# Patient Record
Sex: Male | Born: 1990 | Race: White | Hispanic: No | Marital: Single | Smoking: Former smoker
Health system: Southern US, Community
[De-identification: ages and names within clinical notes are randomized; demographics above are authoritative.]

---
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
## 2012-10-02 ENCOUNTER — Other Ambulatory Visit: Payer: Self-pay | Admitting: Gastroenterology

## 2012-10-02 DIAGNOSIS — R109 Unspecified abdominal pain: Secondary | ICD-10-CM

## 2012-10-09 ENCOUNTER — Ambulatory Visit
Admission: RE | Admit: 2012-10-09 | Discharge: 2012-10-09 | Disposition: A | Discharge status: Home / Self Care | Payer: BC Managed Care – PPO | Source: Ambulatory Visit | Attending: Gastroenterology | Admitting: Gastroenterology

## 2012-10-09 DIAGNOSIS — R109 Unspecified abdominal pain: Secondary | ICD-10-CM

## 2014-12-20 IMAGING — US US ABDOMEN COMPLETE
1 series · 14 of 25 positions shown · non-contrast
Comparison: None.

CLINICAL DATA: Abdominal pain

COMPLETE ABDOMINAL ULTRASOUND

[Series 1: us abdomen complete · 0.18mm/px · 14 of 86 slices shown]
[im 1/86]
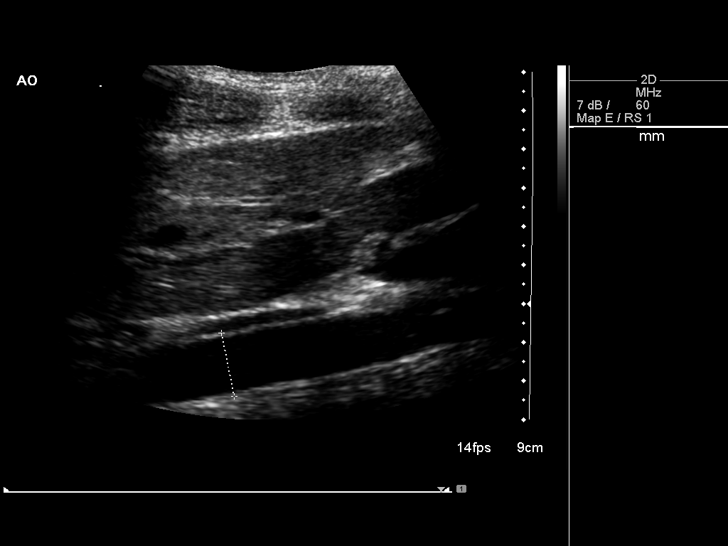
[im 8/86]
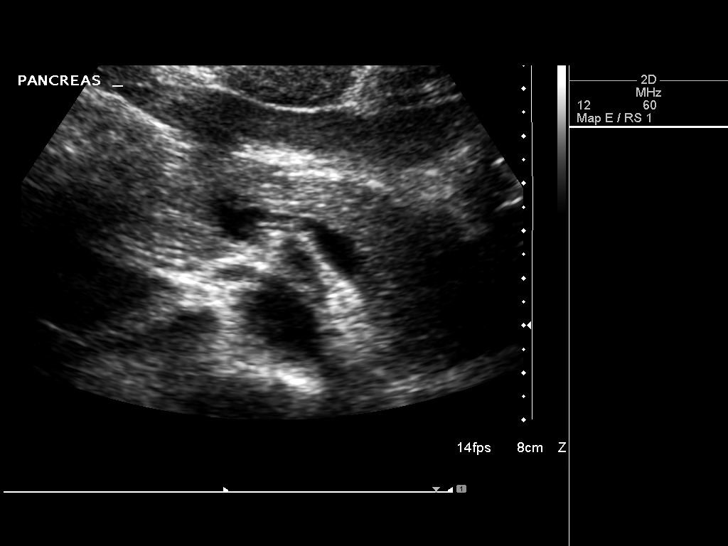
[im 15/86]
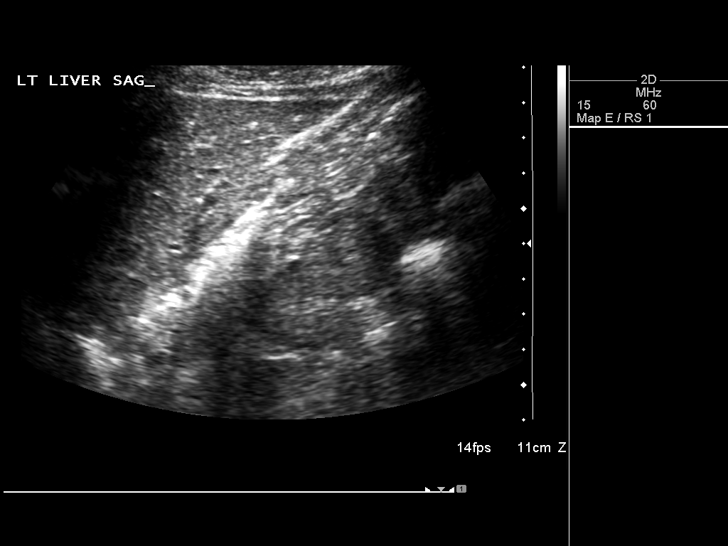
[im 22/86]
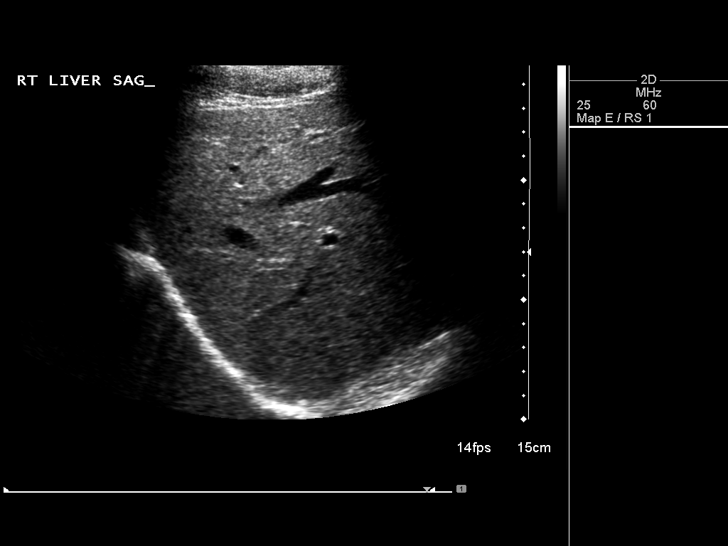
[im 29/86]
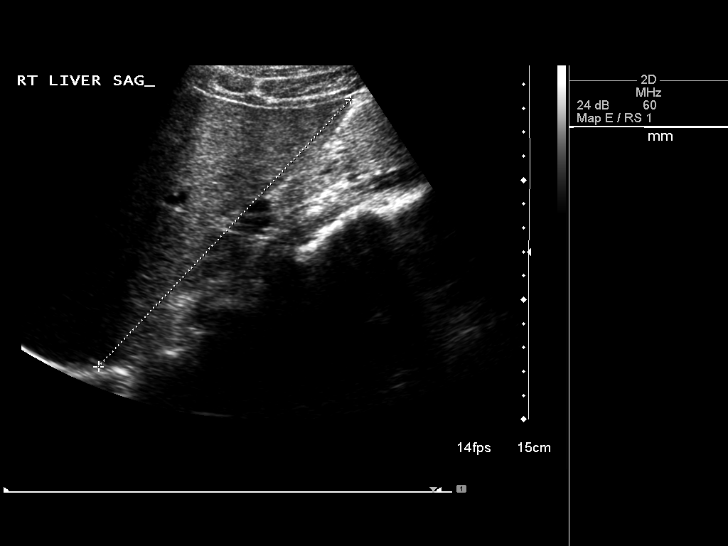
[im 32/86]
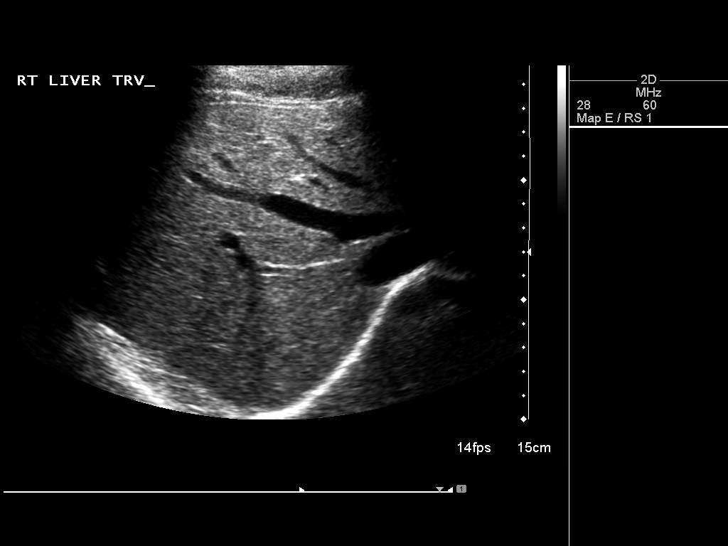
[im 39/86]
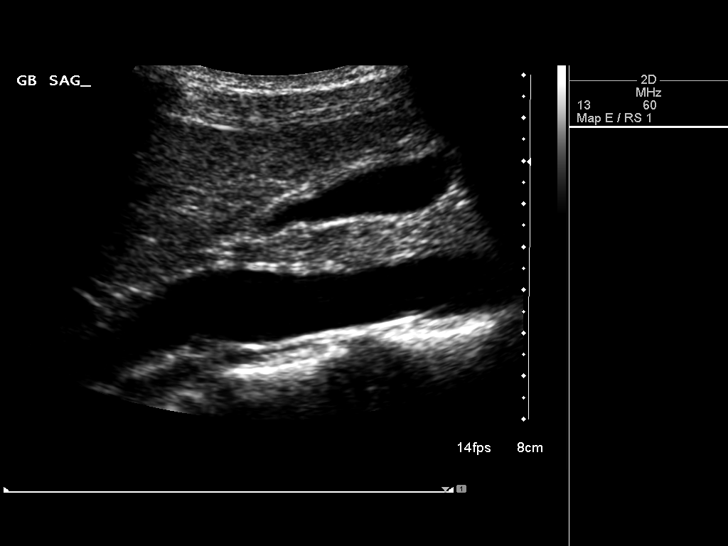
[im 47/86]
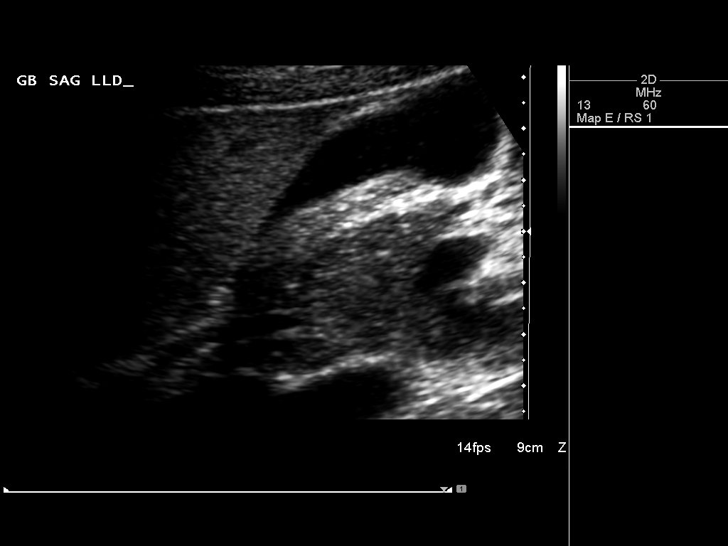
[im 54/86]
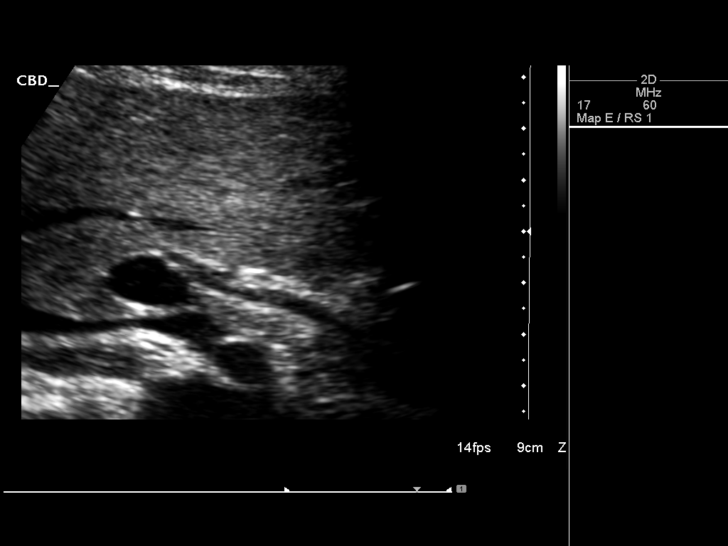
[im 57/86]
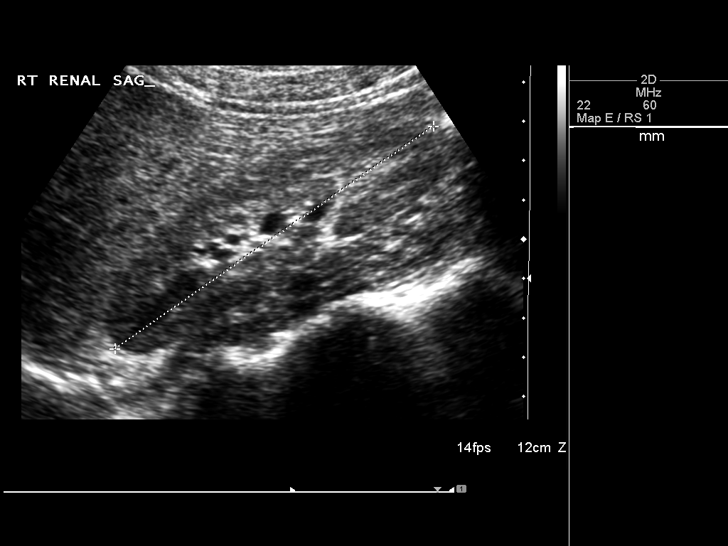
[im 64/86]
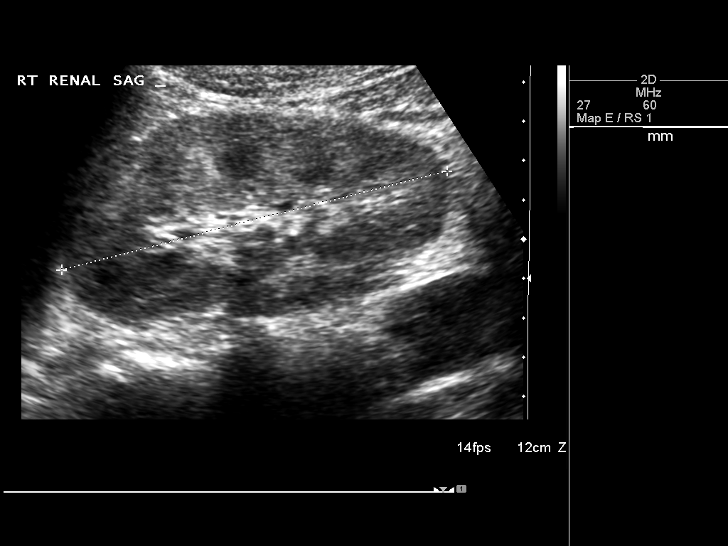
[im 71/86]
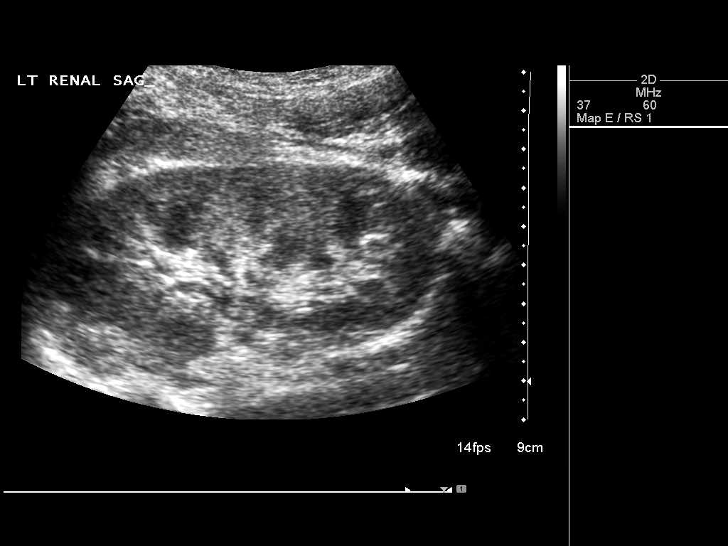
[im 78/86]
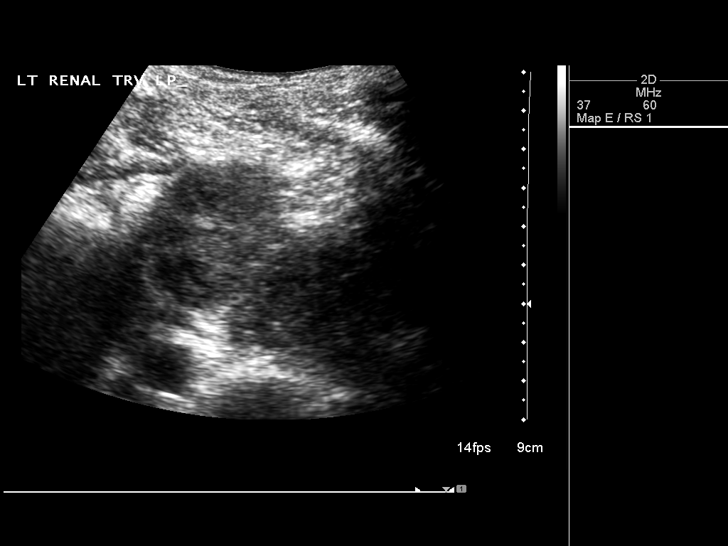
[im 86/86]
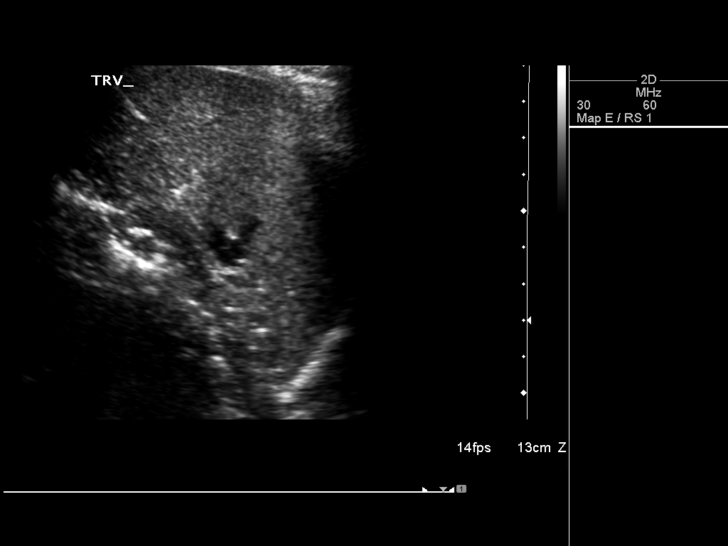

[14 of 25 positions shown; findings below may reference images not displayed]

FINDINGS: Gallbladder:  The gallbladder is visualized and no gallstones are
noted.  There is no pain over the gallbladder with compression.

Common bile duct:  The common bile duct is normal measuring 3.3 mm
in diameter.

Liver:  The liver has a normal echogenic pattern.  No focal
abnormality is seen.

IVC:  Appears normal.

Pancreas:  No focal abnormality seen.

Spleen:  The spleen is normal measuring 6.2 cm sagittally.

Right Kidney:  No hydronephrosis is seen.  The right kidney
measures 10.1 cm sagittally.

Left Kidney:  No hydronephrosis is noted.  The left kidney measures
10.5 cm.

Abdominal aorta:  The abdominal aorta is normal in caliber.
IMPRESSION: Negative abdominal ultrasound.  No gallstones.  No ductal
dilatation

## 2016-05-09 ENCOUNTER — Emergency Department (HOSPITAL_COMMUNITY): Payer: 59

## 2016-05-09 ENCOUNTER — Encounter (HOSPITAL_COMMUNITY): Payer: Self-pay | Admitting: Emergency Medicine

## 2016-05-09 ENCOUNTER — Emergency Department (HOSPITAL_COMMUNITY)
Admission: EM | Admit: 2016-05-09 | Discharge: 2016-05-09 | Disposition: A | Discharge status: Home / Self Care | Payer: 59 | Attending: Emergency Medicine | Admitting: Emergency Medicine

## 2016-05-09 DIAGNOSIS — F17210 Nicotine dependence, cigarettes, uncomplicated: Secondary | ICD-10-CM | POA: Insufficient documentation

## 2016-05-09 DIAGNOSIS — R1013 Epigastric pain: Secondary | ICD-10-CM | POA: Insufficient documentation

## 2016-05-09 DIAGNOSIS — R197 Diarrhea, unspecified: Secondary | ICD-10-CM | POA: Insufficient documentation

## 2016-05-09 DIAGNOSIS — R112 Nausea with vomiting, unspecified: Secondary | ICD-10-CM | POA: Insufficient documentation

## 2016-05-09 LAB — COMPREHENSIVE METABOLIC PANEL
ALK PHOS: 53 U/L (ref 38–126)
ALT: 22 U/L (ref 17–63)
ANION GAP: 16 — AB (ref 5–15)
AST: 32 U/L (ref 15–41)
Albumin: 5 g/dL (ref 3.5–5.0)
BILIRUBIN TOTAL: 0.6 mg/dL (ref 0.3–1.2)
BUN: 13 mg/dL (ref 6–20)
CALCIUM: 10.1 mg/dL (ref 8.9–10.3)
CO2: 21 mmol/L — ABNORMAL LOW (ref 22–32)
CREATININE: 1.19 mg/dL (ref 0.61–1.24)
Chloride: 102 mmol/L (ref 101–111)
GFR calc non Af Amer: >60 mL/min (ref >60)
Glucose, Bld: 138 mg/dL — ABNORMAL HIGH (ref 65–99)
Potassium: 3.4 mmol/L — ABNORMAL LOW (ref 3.5–5.1)
Sodium: 139 mmol/L (ref 135–145)
TOTAL PROTEIN: 7.7 g/dL (ref 6.5–8.1)

## 2016-05-09 LAB — CBC
HEMATOCRIT: 48.7 % (ref 39.0–52.0)
HEMOGLOBIN: 17.4 g/dL — AB (ref 13.0–17.0)
MCH: 30.6 pg (ref 26.0–34.0)
MCHC: 35.7 g/dL (ref 30.0–36.0)
MCV: 85.7 fL (ref 78.0–100.0)
Platelets: 233 10*3/uL (ref 150–400)
RBC: 5.68 MIL/uL (ref 4.22–5.81)
RDW: 12.4 % (ref 11.5–15.5)
WBC: 13.1 10*3/uL — ABNORMAL HIGH (ref 4.0–10.5)

## 2016-05-09 MED ORDER — ONDANSETRON HCL 4 MG PO TABS: 4.0000 mg | ORAL_TABLET | Freq: Three times a day (TID) | ORAL | 0 refills | Status: DC | PRN

## 2016-05-09 MED ORDER — MORPHINE SULFATE (PF) 4 MG/ML IV SOLN
4.0000 mg | Freq: Once | INTRAVENOUS | Status: AC
  Administered 2016-05-09: 4 mg via INTRAVENOUS
  Filled 2016-05-09: qty 1

## 2016-05-09 MED ORDER — ONDANSETRON HCL 4 MG/2ML IJ SOLN
INTRAMUSCULAR | Status: AC
  Filled 2016-05-09: qty 2

## 2016-05-09 MED ORDER — ONDANSETRON HCL 4 MG/2ML IJ SOLN
4.0000 mg | Freq: Once | INTRAMUSCULAR | Status: AC
  Administered 2016-05-09: 4 mg via INTRAVENOUS
  Filled 2016-05-09: qty 2

## 2016-05-09 MED ORDER — PROMETHAZINE HCL 25 MG/ML IJ SOLN
25.0000 mg | Freq: Once | INTRAMUSCULAR | Status: AC
  Administered 2016-05-09: 25 mg via INTRAVENOUS
  Filled 2016-05-09: qty 1

## 2016-05-09 MED ORDER — IOPAMIDOL (ISOVUE-300) INJECTION 61%
INTRAVENOUS | Status: AC
  Administered 2016-05-09: 100 mL
  Filled 2016-05-09: qty 100

## 2016-05-09 MED ORDER — MORPHINE SULFATE (PF) 4 MG/ML IV SOLN: 4.0000 mg | Freq: Once | INTRAVENOUS | Status: DC

## 2016-05-09 MED ORDER — ONDANSETRON HCL 4 MG/2ML IJ SOLN
4.0000 mg | Freq: Once | INTRAMUSCULAR | Status: AC | PRN
  Administered 2016-05-09: 4 mg via INTRAVENOUS

## 2016-05-09 MED ORDER — FENTANYL CITRATE (PF) 100 MCG/2ML IJ SOLN
50.0000 ug | Freq: Once | INTRAMUSCULAR | Status: AC
  Administered 2016-05-09: 50 ug via INTRAVENOUS
  Filled 2016-05-09: qty 2

## 2016-05-09 MED ORDER — MORPHINE SULFATE (PF) 4 MG/ML IV SOLN
2.0000 mg | Freq: Once | INTRAVENOUS | Status: AC
  Administered 2016-05-09: 2 mg via INTRAVENOUS
  Filled 2016-05-09: qty 1

## 2016-05-09 MED ORDER — HYDROCODONE-ACETAMINOPHEN 5-325 MG PO TABS: ORAL_TABLET | ORAL | 0 refills | Status: AC

## 2016-05-09 MED ORDER — ONDANSETRON HCL 4 MG/2ML IJ SOLN: 4.0000 mg | Freq: Once | INTRAMUSCULAR | Status: DC

## 2016-05-09 MED ORDER — SODIUM CHLORIDE 0.9 % IV BOLUS (SEPSIS)
2000.0000 mL | Freq: Once | INTRAVENOUS | Status: AC
  Administered 2016-05-09: 2000 mL via INTRAVENOUS

## 2016-05-09 MED ORDER — ONDANSETRON HCL 4 MG/2ML IJ SOLN
4.0000 mg | Freq: Once | INTRAMUSCULAR | Status: AC
  Administered 2016-05-09: 4 mg via INTRAVENOUS

## 2016-05-09 NOTE — ED Notes (Signed)
Dr. Blinda LeatherwoodPollina updated on pt.'s persistent emesis . 2nd Zofran ordered.

## 2016-05-09 NOTE — ED Notes (Signed)
Declined W/C at D/C and was escorted to lobby by RN. 

## 2016-05-09 NOTE — ED Triage Notes (Signed)
Pt. reports generalized abdominal pain with emesis and diarrhea onset this evening , denies fever or chills.  

## 2016-05-09 NOTE — ED Provider Notes (Signed)
[facility]-EMERGENCY DEPT Provider Note   CSN: [id number] Arrival date & time: 05/09/16  0300     History   Chief Complaint Chief Complaint  Patient presents with  . Abdominal Pain  . Emesis  . Diarrhea    HPI   Blood pressure 105/55, pulse 68, temperature 97.8 F (36.6 C), temperature source Oral, resp. rate 24, height 5\' 7"  (1.702 m), weight 54.4 kg, SpO2 96 %.  Leroy Lowery is a 26 y.o. male who is otherwise healthy complaining of acute onset of epigastric pain at approximately midnight last night, the pain woke him from sleep and it was followed by nonbloody, nonbilious, no coffee-ground emesis with diarrhea that is looser than normal but no hematochezia or melena. He denies fevers, chills, sick contacts. He states the pain is severe, 10 out of 10 with no exacerbating or alleviating factors identified.  History reviewed. No pertinent past medical history.  There are no active problems to display for this patient.   History reviewed. No pertinent surgical history.     Home Medications    Prior to Admission medications   Medication Sig Start Date End Date Taking? Authorizing Provider  HYDROcodone-acetaminophen (NORCO/VICODIN) 5-325 MG tablet Take 1-2 tablets by mouth every 6 hours as needed for pain and/or cough. 05/09/16   Alegria Dominique, PA-C  ondansetron (ZOFRAN) 4 MG tablet Take 1 tablet (4 mg total) by mouth every 8 (eight) hours as needed for nausea or vomiting. 05/09/16   Joni ReiningNicole Daylyn Christine, PA-C    Family History No family history on file.  Social History Social History  Substance Use Topics  . Smoking status: Current Every Day Smoker    Types: Cigarettes  . Smokeless tobacco: Never Used  . Alcohol use No     Allergies   Patient has no known allergies.   Review of Systems Review of Systems  10 systems reviewed and found to be negative, except as noted in the HPI.  Physical Exam Updated Vital Signs BP 105/56   Pulse 90   Temp 97.8 F (36.6 C) (Oral)    Resp 24   Ht 5\' 7"  (1.702 m)   Wt 54.4 kg   SpO2 98%   BMI 18.79 kg/m   Physical Exam  Constitutional: He is oriented to person, place, and time. He appears well-developed and well-nourished. No distress.  HENT:  Head: Normocephalic and atraumatic.  Mouth/Throat: Oropharynx is clear and moist.  Eyes: Conjunctivae and EOM are normal. Pupils are equal, round, and reactive to light.  Neck: Normal range of motion.  Cardiovascular: Normal rate, regular rhythm and intact distal pulses.   Pulmonary/Chest: Effort normal and breath sounds normal. No respiratory distress. He has no wheezes. He has no rales.  Abdominal: Soft. There is tenderness.  Hyperactive bowel signs, diffusely is only tender to light palpation, worse in the epigastrium. No guarding or rebound.  Musculoskeletal: Normal range of motion.  Neurological: He is alert and oriented to person, place, and time.  Skin: He is not diaphoretic. Erythema: .npmdm.  Psychiatric: He has a normal mood and affect.  Nursing note and vitals reviewed.    ED Treatments / Results  Labs (all labs ordered are listed, but only abnormal results are displayed) Labs Reviewed  COMPREHENSIVE METABOLIC PANEL - Abnormal; Notable for the following:       Result Value   Potassium 3.4 (*)    CO2 21 (*)    Glucose, Bld 138 (*)    Anion gap 16 (*)  All other components within normal limits  CBC - Abnormal; Notable for the following:    WBC 13.1 (*)    Hemoglobin 17.4 (*)    All other components within normal limits  URINALYSIS, ROUTINE W REFLEX MICROSCOPIC    EKG  EKG Interpretation None       Radiology Ct Abdomen Pelvis W Contrast  Result Date: 05/09/2016 CLINICAL DATA:  Epigastric pain, nausea, vomiting since midnight EXAM: CT ABDOMEN AND PELVIS WITH CONTRAST TECHNIQUE: Multidetector CT imaging of the abdomen and pelvis was performed using the standard protocol following bolus administration of intravenous contrast. CONTRAST:   ISOVUE-300 IOPAMIDOL (ISOVUE-300) INJECTION 61% COMPARISON:  None. FINDINGS: Lower chest: No acute abnormality. Hepatobiliary: No focal liver abnormality is seen. No gallstones, gallbladder wall thickening, or biliary dilatation. Pancreas: Unremarkable. No pancreatic ductal dilatation or surrounding inflammatory changes. Spleen: Normal in size without focal abnormality. Adrenals/Urinary Tract: Adrenal glands are unremarkable. Kidneys are normal, without renal calculi, focal lesion, or hydronephrosis. Bladder is unremarkable. Stomach/Bowel: Stomach is within normal limits. No normal nor abnormal appendix is identified. No pneumoperitoneum, pneumatosis or portal venous gas. Bowel wall thickening involving the small bowel and colon as can be seen with enterocolitis secondary to an infectious or inflammatory etiology. Vascular/Lymphatic: No significant vascular findings are present. No enlarged abdominal or pelvic lymph nodes. Reproductive: Prostate is unremarkable. Other: No abdominal wall hernia or abnormality. No abdominopelvic ascites. Musculoskeletal: No acute or significant osseous findings. IMPRESSION: 1. Bowel wall thickening involving the small bowel and colon as can be seen with enterocolitis secondary to an infectious or inflammatory etiology. Electronically Signed   By: Elige Ko   On: 05/09/2016 08:26    Procedures Procedures (including critical care time)  Medications Ordered in ED Medications  ondansetron (ZOFRAN) injection 4 mg (4 mg Intravenous Given 05/09/16 0344)  fentaNYL (SUBLIMAZE) injection 50 mcg (50 mcg Intravenous Given 05/09/16 0344)  ondansetron (ZOFRAN) injection 4 mg (4 mg Intravenous Given 05/09/16 0541)  sodium chloride 0.9 % bolus 2,000 mL (0 mLs Intravenous Stopped 05/09/16 1341)  morphine 4 MG/ML injection 4 mg (4 mg Intravenous Given 05/09/16 0711)  ondansetron (ZOFRAN) injection 4 mg (4 mg Intravenous Given 05/09/16 0711)  iopamidol (ISOVUE-300) 61 % injection (100 mLs  Contrast  Given 05/09/16 0754)  promethazine (PHENERGAN) injection 25 mg (25 mg Intravenous Given 05/09/16 1040)  morphine 4 MG/ML injection 2 mg (2 mg Intravenous Given 05/09/16 1038)     Initial Impression / Assessment and Plan / ED Course  I have reviewed the triage vital signs and the nursing notes.  Pertinent labs & imaging results that were available during my care of the patient were reviewed by me and considered in my medical decision making (see chart for details).  Clinical Course     Vitals:   05/09/16 1000 05/09/16 1015 05/09/16 1045 05/09/16 1131  BP: 116/64 114/63 109/61 105/56  Pulse: 71 77 71 90  Resp:      Temp:      TempSrc:      SpO2: 92% 100% 100% 98%  Weight:      Height:        Medications  ondansetron (ZOFRAN) injection 4 mg (4 mg Intravenous Given 05/09/16 0344)  fentaNYL (SUBLIMAZE) injection 50 mcg (50 mcg Intravenous Given 05/09/16 0344)  ondansetron (ZOFRAN) injection 4 mg (4 mg Intravenous Given 05/09/16 0541)  sodium chloride 0.9 % bolus 2,000 mL (0 mLs Intravenous Stopped 05/09/16 1341)  morphine 4 MG/ML injection 4 mg (4 mg Intravenous Given  05/09/16 0711)  ondansetron (ZOFRAN) injection 4 mg (4 mg Intravenous Given 05/09/16 0711)  iopamidol (ISOVUE-300) 61 % injection (100 mLs  Contrast Given 05/09/16 0754)  promethazine (PHENERGAN) injection 25 mg (25 mg Intravenous Given 05/09/16 1040)  morphine 4 MG/ML injection 2 mg (2 mg Intravenous Given 05/09/16 1038)    Leroy Lowery is 26 y.o. male presenting with Severe epigastric pain, woke him from sleep with associated nausea vomiting and diarrhea. Patient afebrile, looks severely uncomfortable, he has a elevated white count, his first blood pressure was soft with systolic under 90. Bicarbonate elevated and anion gap elevated. Although I think is likely a viral gastroenteritis given the severity of his pain and the abnormalities in his blood work I think we need to further investigate with imaging, CT abdomen pelvis is ordered,  advised him to remain nothing by mouth. Will aggressively hydrate andReassess.  CT abdomen pelvis with no acute abnormality thickening of the colon consistent with the enteritis, likely viral given the acute onset and vomiting. Patient is tolerating by mouth's and repeat abdominal exam unchanged.  Evaluation does not show pathology that would require ongoing emergent intervention or inpatient treatment. Pt is hemodynamically stable and mentating appropriately. Discussed findings and plan with patient/guardian, who agrees with care plan. All questions answered. Return precautions discussed and outpatient follow up given.      Final Clinical Impressions(s) / ED Diagnoses   Final diagnoses:  Nausea vomiting and diarrhea  Epigastric pain    New Prescriptions Discharge Medication List as of 05/09/2016 11:43 AM    START taking these medications   Details  HYDROcodone-acetaminophen (NORCO/VICODIN) 5-325 MG tablet Take 1-2 tablets by mouth every 6 hours as needed for pain and/or cough., Print    ondansetron (ZOFRAN) 4 MG tablet Take 1 tablet (4 mg total) by mouth every 8 (eight) hours as needed for nausea or vomiting., Starting Tue 05/09/2016, Print         [place], PA-C 05/09/16 1556    Lavera Guise, MD 05/09/16 1715

## 2016-05-09 NOTE — ED Notes (Signed)
PT GIVEN WATER PER FLUID CHALLENGE

## 2016-05-09 NOTE — Discharge Instructions (Signed)

## 2016-05-09 NOTE — ED Notes (Signed)
Pt back from CT

## 2023-01-31 ENCOUNTER — Other Ambulatory Visit: Payer: Self-pay

## 2023-01-31 ENCOUNTER — Other Ambulatory Visit (HOSPITAL_BASED_OUTPATIENT_CLINIC_OR_DEPARTMENT_OTHER): Payer: Self-pay

## 2023-01-31 ENCOUNTER — Encounter (HOSPITAL_BASED_OUTPATIENT_CLINIC_OR_DEPARTMENT_OTHER): Payer: Self-pay | Admitting: Emergency Medicine

## 2023-01-31 ENCOUNTER — Emergency Department (HOSPITAL_BASED_OUTPATIENT_CLINIC_OR_DEPARTMENT_OTHER)
Admission: EM | Admit: 2023-01-31 | Discharge: 2023-01-31 | Disposition: A | Discharge status: Home / Self Care | Payer: 59 | Attending: Emergency Medicine | Admitting: Emergency Medicine

## 2023-01-31 ENCOUNTER — Emergency Department (HOSPITAL_BASED_OUTPATIENT_CLINIC_OR_DEPARTMENT_OTHER): Payer: 59

## 2023-01-31 DIAGNOSIS — R1031 Right lower quadrant pain: Secondary | ICD-10-CM | POA: Insufficient documentation

## 2023-01-31 LAB — CBC WITH DIFFERENTIAL/PLATELET
Abs Immature Granulocytes: 0.05 10*3/uL (ref 0.00–0.07)
Basophils Absolute: 0.1 10*3/uL (ref 0.0–0.1)
Basophils Relative: 1 %
Eosinophils Absolute: 0.1 10*3/uL (ref 0.0–0.5)
Eosinophils Relative: 1 %
HCT: 46.2 % (ref 39.0–52.0)
Hemoglobin: 16 g/dL (ref 13.0–17.0)
Immature Granulocytes: 0 %
Lymphocytes Relative: 15 %
Lymphs Abs: 1.9 10*3/uL (ref 0.7–4.0)
MCH: 29.9 pg (ref 26.0–34.0)
MCHC: 34.6 g/dL (ref 30.0–36.0)
MCV: 86.2 fL (ref 80.0–100.0)
Monocytes Absolute: 0.9 10*3/uL (ref 0.1–1.0)
Monocytes Relative: 7 %
Neutro Abs: 9.7 10*3/uL — ABNORMAL HIGH (ref 1.7–7.7)
Neutrophils Relative %: 76 %
Platelets: 262 10*3/uL (ref 150–400)
RBC: 5.36 MIL/uL (ref 4.22–5.81)
RDW: 12.8 % (ref 11.5–15.5)
WBC: 12.8 10*3/uL — ABNORMAL HIGH (ref 4.0–10.5)
nRBC: 0 % (ref 0.0–0.2)

## 2023-01-31 LAB — URINALYSIS, ROUTINE W REFLEX MICROSCOPIC
Bilirubin Urine: NEGATIVE
Glucose, UA: NEGATIVE mg/dL
Hgb urine dipstick: NEGATIVE
Ketones, ur: 15 mg/dL — AB
Leukocytes,Ua: NEGATIVE
Nitrite: NEGATIVE
Protein, ur: NEGATIVE mg/dL
Specific Gravity, Urine: 1.01 (ref 1.005–1.030)
pH: 7 (ref 5.0–8.0)

## 2023-01-31 LAB — COMPREHENSIVE METABOLIC PANEL
ALT: 31 U/L (ref 0–44)
AST: 23 U/L (ref 15–41)
Albumin: 4.5 g/dL (ref 3.5–5.0)
Alkaline Phosphatase: 49 U/L (ref 38–126)
Anion gap: 9 (ref 5–15)
BUN: 10 mg/dL (ref 6–20)
CO2: 28 mmol/L (ref 22–32)
Calcium: 9.2 mg/dL (ref 8.9–10.3)
Chloride: 101 mmol/L (ref 98–111)
Creatinine, Ser: 1.03 mg/dL (ref 0.61–1.24)
GFR, Estimated: >60 mL/min (ref >60)
Glucose, Bld: 92 mg/dL (ref 70–99)
Potassium: 3.9 mmol/L (ref 3.5–5.1)
Sodium: 138 mmol/L (ref 135–145)
Total Bilirubin: 0.8 mg/dL (ref 0.3–1.2)
Total Protein: 7.2 g/dL (ref 6.5–8.1)

## 2023-01-31 LAB — LIPASE, BLOOD: Lipase: 18 U/L (ref 11–51)

## 2023-01-31 MED ORDER — ONDANSETRON HCL 4 MG PO TABS
4.0000 mg | ORAL_TABLET | Freq: Four times a day (QID) | ORAL | 0 refills | Status: AC
  Filled 2023-01-31: qty 28, 7d supply, fill #0

## 2023-01-31 MED ORDER — KETOROLAC TROMETHAMINE 15 MG/ML IJ SOLN
15.0000 mg | Freq: Once | INTRAMUSCULAR | Status: AC
  Administered 2023-01-31: 15 mg via INTRAVENOUS
  Filled 2023-01-31: qty 1

## 2023-01-31 MED ORDER — IOHEXOL 300 MG/ML  SOLN
100.0000 mL | Freq: Once | INTRAMUSCULAR | Status: AC | PRN
  Administered 2023-01-31: 80 mL via INTRAVENOUS

## 2023-01-31 NOTE — ED Provider Notes (Signed)
[facility] AT [facility] Provider Note   CSN: [id number] Arrival date & time: 01/31/23  1119     History  Chief Complaint  Patient presents with   Abdominal Pain    Leroy Lowery is a 32 y.o. male.  32 y.o male with no PMH presents to the ED with a chief complaint of right lower abdominal pain which has been ongoing since Monday.  Patient was evaluated urgent care today, referred to the emergency department in order to have further evaluation for appendicitis.  He describes his pain as a constant sharp pain exacerbated with any palpation along with movement but no alleviating factors.  Has not taken any medication for improvement in symptoms.  Did have 1 episode of nonbilious, nonbloody emesis last night as he tried to take some diazepam to go to sleep.  He has had decrease in oral intake with his last meal being a piece of toast yesterday.  Not endorsing any fevers. NO urinary symptoms, no prior surgical intervention to his abdomen.  Last bowel movement 24 hours ago consistent with diarrhea. No blood in his stool.   The history is provided by the patient.  Abdominal Pain Pain location:  RLQ Pain quality: sharp   Pain radiates to:  Does not radiate Pain severity:  Moderate Associated symptoms: diarrhea, nausea and vomiting   Associated symptoms: no chest pain, no chills, no constipation, no fever and no shortness of breath        Home Medications Prior to Admission medications   Medication Sig Start Date End Date Taking? Authorizing Provider  ondansetron (ZOFRAN) 4 MG tablet Take 1 tablet (4 mg total) by mouth every 6 (six) hours for 7 days. 01/31/23 02/07/23 Yes Raeshawn Vo, Leonie Douglas, PA-C  HYDROcodone-acetaminophen (NORCO/VICODIN) 5-325 MG tablet Take 1-2 tablets by mouth every 6 hours as needed for pain and/or cough. 05/09/16   [place], Joni Reining, PA-C      Allergies    Patient has no known allergies.    Review of Systems   Review of Systems   Constitutional:  Negative for chills and fever.  Respiratory:  Negative for shortness of breath.   Cardiovascular:  Negative for chest pain.  Gastrointestinal:  Positive for abdominal pain, diarrhea, nausea and vomiting. Negative for constipation.  All other systems reviewed and are negative.   Physical Exam Updated Vital Signs BP 110/75   Pulse 70   Temp 98.5 F (36.9 C) (Oral)   Resp 18   Ht 5\' 7"  (1.702 m)   Wt 61.2 kg   SpO2 99%   BMI 21.14 kg/m  Physical Exam Vitals and nursing note reviewed.  Constitutional:      Appearance: He is well-developed.  HENT:     Head: Normocephalic and atraumatic.  Eyes:     General: No scleral icterus.    Pupils: Pupils are equal, round, and reactive to light.  Cardiovascular:     Heart sounds: Normal heart sounds.  Pulmonary:     Effort: Pulmonary effort is normal.     Breath sounds: Normal breath sounds. No wheezing.  Chest:     Chest wall: No tenderness.  Abdominal:     General: Bowel sounds are normal. There is no distension.     Palpations: Abdomen is soft.     Tenderness: There is abdominal tenderness in the right lower quadrant. There is no right CVA tenderness, left CVA tenderness, guarding or rebound.  Musculoskeletal:        General: No tenderness  or deformity.     Cervical back: Normal range of motion.  Skin:    General: Skin is warm and dry.  Neurological:     Mental Status: He is alert and oriented to person, place, and time.     ED Results / Procedures / Treatments   Labs (all labs ordered are listed, but only abnormal results are displayed) Labs Reviewed  CBC WITH DIFFERENTIAL/PLATELET - Abnormal; Notable for the following components:      Result Value   WBC 12.8 (*)    Neutro Abs 9.7 (*)    All other components within normal limits  URINALYSIS, ROUTINE W REFLEX MICROSCOPIC - Abnormal; Notable for the following components:   Ketones, ur 15 (*)    All other components within normal limits  COMPREHENSIVE  METABOLIC PANEL  LIPASE, BLOOD    EKG None  Radiology CT ABDOMEN PELVIS W CONTRAST  Result Date: 01/31/2023 CLINICAL DATA:  Mid abdominal and right lower quadrant abdominal pain for several days. EXAM: CT ABDOMEN AND PELVIS WITH CONTRAST TECHNIQUE: Multidetector CT imaging of the abdomen and pelvis was performed using the standard protocol following bolus administration of intravenous contrast. RADIATION DOSE REDUCTION: This exam was performed according to the departmental dose-optimization program which includes automated exposure control, adjustment of the mA and/or kV according to patient size and/or use of iterative reconstruction technique. CONTRAST:  80mL OMNIPAQUE IOHEXOL 300 MG/ML  SOLN COMPARISON:  05/09/2016 FINDINGS: Lower Chest: No acute findings. Hepatobiliary: No suspicious hepatic masses identified. Gallbladder is unremarkable. No evidence of biliary ductal dilatation. Pancreas:  No mass or inflammatory changes. Spleen: Within normal limits in size and appearance. Adrenals/Urinary Tract: No suspicious masses identified. No evidence of ureteral calculi or hydronephrosis. Stomach/Bowel: No evidence of obstruction, inflammatory process or abnormal fluid collections. Normal appendix visualized. Vascular/Lymphatic: No pathologically enlarged lymph nodes. No acute vascular findings. Reproductive:  No mass or other significant abnormality. Other:  None. Musculoskeletal:  No suspicious bone lesions identified. IMPRESSION: Negative. No evidence of appendicitis or other acute findings. Electronically Signed   By: Danae Orleans M.D.   On: 01/31/2023 13:18    Procedures Procedures    Medications Ordered in ED Medications  ketorolac (TORADOL) 15 MG/ML injection 15 mg (has no administration in time range)  iohexol (OMNIPAQUE) 300 MG/ML solution 100 mL (80 mLs Intravenous Contrast Given 01/31/23 1224)    ED Course/ Medical Decision Making/ A&P Clinical Course as of 01/31/23 1351  Wed Jan 31, 2023  1212 WBC(!): 12.8 [JS]    Clinical Course User Index [JS] Claude Manges, PA-C                                 Medical Decision Making Amount and/or Complexity of Data Reviewed Labs: ordered. Decision-making details documented in ED Course. Radiology: ordered.  Risk Prescription drug management.   This patient presents to the ED for concern of abdominal pain, this involves a number of treatment options, and is a complaint that carries with it a high risk of complications and morbidity.  The differential diagnosis includes appendicitis, cholecystitis, viral illness versus diverticulitis.    Co morbidities: Discussed in HPI   Brief History:  See HPI.   EMR reviewed including pt PMHx, past surgical history and past visits to ER.   See HPI for more details   Lab Tests:  I ordered and independently interpreted labs.  The pertinent results include:    I personally  reviewed all laboratory work and imaging. Metabolic panel without any acute abnormality specifically kidney function within normal limits and no significant electrolyte abnormalities. CBC without leukocytosis or significant anemia. Lipase level is normal. UA with no nitrites versus leukocytes.   Imaging Studies:  CT Abdomen pelvis with contrast: IMPRESSION:  Negative. No evidence of appendicitis or other acute findings.    Medicines ordered:  Offered pain medication at this time along with nausea medication that he refuses.  Reevaluation:  After the interventions noted above I re-evaluated patient and found that they have :stayed the same  Social Determinants of Health:  The patient's social determinants of health were a factor in the care of this patient  Problem List / ED Course:  Patient presents to the ED with right lower quadrant pain which has been ongoing since last night, some anorexia, some nausea and vomiting.  No fevers or chills, no blood in his stool and last bowel movement consistent  with diarrhea approximately 24 hours ago.  Evaluated urgent care and sent to the emergency department to rule out appendicitis.  On evaluation patient appears overall in no distress, his abdomen is soft, there is tenderness to palpation along the right lower quadrant but also around the entire abdomen.  His bowel sounds are somewhat diminished but states he has barely had anything to eat in the last 24 hours.  He did have some toast last night.  Labs today showed a CBC with slight leukocytosis however this is ongoing.  Hemoglobin is within normal limits.  CMP with no electrolyte derangement, creatinine levels unremarkable.  LFTs are within normal limits.  UA with no nitrites, or leukocytes noted. He denies any urinary symptoms at this time, there is no CVA tenderness on my exam to suggest renal etiology.  LFTs are normal, no history of alcohol abuse suggest cholecystitis versus cholelithiasis.  He did not have any episodes of vomiting while in the emergency department.  A CT abdomen and pelvis was obtained to rule out appendicitis versus obstruction.  No acute finding noted.  No signs of obstruction, I did give him some Toradol to help with pain control.  He is requesting some Zofran to help with nausea at home.  We discussed close follow-up with PCP and return to the emergency department if symptoms worsen. Patient also with diarrhea however no blood, to suggest antibiotic therapy at this time.  He is only had this for the past 3 days.  Will continue to monitor at home.  Will follow-up with PCP as needed. Dispostion:  After consideration of the diagnostic results and the patients response to treatment, I feel that the patent would benefit from close follow up with PCP.    Portions of this note were generated with Scientist, clinical (histocompatibility and immunogenetics). Dictation errors may occur despite best attempts at proofreading.   Final Clinical Impression(s) / ED Diagnoses Final diagnoses:  Right lower quadrant pain    Rx /  DC Orders ED Discharge Orders          Ordered    ondansetron (ZOFRAN) 4 MG tablet  Every 6 hours        01/31/23 1343              Claude Manges, PA-C 01/31/23 1351    Margarita Grizzle, MD 02/05/23 1152

## 2023-01-31 NOTE — ED Notes (Signed)
Pt given discharge instructions and reviewed prescriptions. Opportunities given for questions. Pt verbalizes understanding. PIV removed x1. Stone,Heather R, RN 

## 2023-01-31 NOTE — ED Triage Notes (Signed)
Pt arrived from home POV with c/o abdominal pain. Stated it started mid-abdomen Monday, but last night RLQ sharp pain, consistent when pushing on it. Pain 6/10. Saw UC today and was sent here to have appendix examined.

## 2023-01-31 NOTE — Discharge Instructions (Addendum)
Your laboratory results were within normal limits today.  We discussed the results of your CT scan, and you are provided with a copy of this for your records.  I have prescribed a short course of medication to help with your nausea, please take 1 tablet every 6 hours for the next 7 days to help with nausea.  If you experience any worsening symptoms you will need to return to the emergency department.

## 2024-04-06 ENCOUNTER — Emergency Department (HOSPITAL_BASED_OUTPATIENT_CLINIC_OR_DEPARTMENT_OTHER)
Admission: EM | Admit: 2024-04-06 | Discharge: 2024-04-06 | Disposition: A | Discharge status: Home / Self Care | Attending: Emergency Medicine | Admitting: Emergency Medicine

## 2024-04-06 ENCOUNTER — Other Ambulatory Visit: Payer: Self-pay

## 2024-04-06 ENCOUNTER — Encounter (HOSPITAL_BASED_OUTPATIENT_CLINIC_OR_DEPARTMENT_OTHER): Payer: Self-pay | Admitting: *Deleted

## 2024-04-06 ENCOUNTER — Emergency Department (HOSPITAL_BASED_OUTPATIENT_CLINIC_OR_DEPARTMENT_OTHER)

## 2024-04-06 DIAGNOSIS — K529 Noninfective gastroenteritis and colitis, unspecified: Secondary | ICD-10-CM | POA: Insufficient documentation

## 2024-04-06 DIAGNOSIS — D72829 Elevated white blood cell count, unspecified: Secondary | ICD-10-CM | POA: Insufficient documentation

## 2024-04-06 LAB — CBC WITH DIFFERENTIAL/PLATELET
Abs Immature Granulocytes: 0.12 K/uL — ABNORMAL HIGH (ref 0.00–0.07)
Basophils Absolute: 0.1 K/uL (ref 0.0–0.1)
Basophils Relative: 1 %
Eosinophils Absolute: 0.1 K/uL (ref 0.0–0.5)
Eosinophils Relative: 0 %
HCT: 49.2 % (ref 39.0–52.0)
Hemoglobin: 17 g/dL (ref 13.0–17.0)
Immature Granulocytes: 1 %
Lymphocytes Relative: 5 %
Lymphs Abs: 1.2 K/uL (ref 0.7–4.0)
MCH: 29.3 pg (ref 26.0–34.0)
MCHC: 34.6 g/dL (ref 30.0–36.0)
MCV: 84.7 fL (ref 80.0–100.0)
Monocytes Absolute: 1.7 K/uL — ABNORMAL HIGH (ref 0.1–1.0)
Monocytes Relative: 7 %
Neutro Abs: 20.8 K/uL — ABNORMAL HIGH (ref 1.7–7.7)
Neutrophils Relative %: 86 %
Platelets: 307 K/uL (ref 150–400)
RBC: 5.81 MIL/uL (ref 4.22–5.81)
RDW: 12.9 % (ref 11.5–15.5)
WBC: 24 K/uL — ABNORMAL HIGH (ref 4.0–10.5)
nRBC: 0 % (ref 0.0–0.2)

## 2024-04-06 LAB — COMPREHENSIVE METABOLIC PANEL WITH GFR
ALT: 37 U/L (ref 0–44)
AST: 30 U/L (ref 15–41)
Albumin: 5 g/dL (ref 3.5–5.0)
Alkaline Phosphatase: 74 U/L (ref 38–126)
Anion gap: 16 — ABNORMAL HIGH (ref 5–15)
BUN: 14 mg/dL (ref 6–20)
CO2: 24 mmol/L (ref 22–32)
Calcium: 10 mg/dL (ref 8.9–10.3)
Chloride: 100 mmol/L (ref 98–111)
Creatinine, Ser: 1.07 mg/dL (ref 0.61–1.24)
GFR, Estimated: >60 mL/min (ref >60)
Glucose, Bld: 113 mg/dL — ABNORMAL HIGH (ref 70–99)
Potassium: 3.8 mmol/L (ref 3.5–5.1)
Sodium: 140 mmol/L (ref 135–145)
Total Bilirubin: 0.6 mg/dL (ref 0.0–1.2)
Total Protein: 7.8 g/dL (ref 6.5–8.1)

## 2024-04-06 LAB — RESP PANEL BY RT-PCR (RSV, FLU A&B, COVID)  RVPGX2
Influenza A by PCR: NEGATIVE
Influenza B by PCR: NEGATIVE
Resp Syncytial Virus by PCR: NEGATIVE
SARS Coronavirus 2 by RT PCR: NEGATIVE

## 2024-04-06 LAB — LIPASE, BLOOD: Lipase: 35 U/L (ref 11–51)

## 2024-04-06 MED ORDER — ONDANSETRON 4 MG PO TBDP: 4.0000 mg | ORAL_TABLET | Freq: Three times a day (TID) | ORAL | 0 refills | Status: AC | PRN

## 2024-04-06 MED ORDER — IOHEXOL 300 MG/ML  SOLN
100.0000 mL | Freq: Once | INTRAMUSCULAR | Status: AC | PRN
  Administered 2024-04-06: 100 mL via INTRAVENOUS

## 2024-04-06 MED ORDER — MORPHINE SULFATE (PF) 4 MG/ML IV SOLN
4.0000 mg | Freq: Once | INTRAVENOUS | Status: AC
  Administered 2024-04-06: 4 mg via INTRAVENOUS
  Filled 2024-04-06: qty 1

## 2024-04-06 MED ORDER — LACTATED RINGERS IV BOLUS
1000.0000 mL | Freq: Once | INTRAVENOUS | Status: AC
  Administered 2024-04-06: 1000 mL via INTRAVENOUS

## 2024-04-06 MED ORDER — ONDANSETRON HCL 4 MG/2ML IJ SOLN
4.0000 mg | Freq: Once | INTRAMUSCULAR | Status: AC
  Administered 2024-04-06: 4 mg via INTRAVENOUS
  Filled 2024-04-06: qty 2

## 2024-04-06 NOTE — Discharge Instructions (Signed)
 You were seen today for vomiting and diarrhea.  This is likely viral in nature.  Take medications as prescribed.  You may add Imodium for any ongoing diarrhea.  Make sure that you are staying hydrated.

## 2024-04-06 NOTE — ED Notes (Signed)
Tolerating PO fluids at this time

## 2024-04-06 NOTE — ED Provider Notes (Signed)
  EMERGENCY DEPARTMENT AT [facility] Provider Note   CSN: [id number] Arrival date & time: 04/06/24  9953     Patient presents with: Abdominal Pain and Nausea   Leroy Lowery is a 33 y.o. male.   HPI     This is a 33 year old male who presents with abdominal pain, nausea, vomiting, and diarrhea.  Onset of symptoms last night around 11 PM.  He reports diffuse crampy sharp abdominal pain.  Reports multiple episodes of nonbilious, nonbloody emesis and multiple episodes of diarrhea.  No known sick contacts.  Prior to Admission medications   Medication Sig Start Date End Date Taking? Authorizing Provider  ondansetron  (ZOFRAN -ODT) 4 MG disintegrating tablet Take 1 tablet (4 mg total) by mouth every 8 (eight) hours as needed. 04/06/24  Yes Wynetta Seith, Charmaine FALCON, MD  HYDROcodone -acetaminophen  (NORCO/VICODIN) 5-325 MG tablet Take 1-2 tablets by mouth every 6 hours as needed for pain and/or cough. 05/09/16   Pisciotta, Nat, PA-C    Allergies: Patient has no known allergies.    Review of Systems  Constitutional:  Positive for chills and fever.  Respiratory:  Negative for shortness of breath.   Cardiovascular:  Negative for chest pain.  Gastrointestinal:  Positive for abdominal pain, diarrhea, nausea and vomiting.  All other systems reviewed and are negative.   Updated Vital Signs BP 99/67 (BP Location: Left Arm)   Pulse 75   Temp 98.2 F (36.8 C) (Oral)   Resp 18   SpO2 97%   Physical Exam Vitals and nursing note reviewed.  Constitutional:      Appearance: He is well-developed. He is ill-appearing. He is not toxic-appearing.  HENT:     Head: Normocephalic and atraumatic.  Eyes:     Pupils: Pupils are equal, round, and reactive to light.  Cardiovascular:     Rate and Rhythm: Regular rhythm. Tachycardia present.     Heart sounds: Normal heart sounds. No murmur heard. Pulmonary:     Effort: Pulmonary effort is normal. No respiratory distress.     Breath  sounds: Normal breath sounds. No wheezing.  Abdominal:     General: Bowel sounds are normal.     Palpations: Abdomen is soft.     Tenderness: There is generalized abdominal tenderness. There is no guarding or rebound.  Musculoskeletal:     Cervical back: Neck supple.  Lymphadenopathy:     Cervical: No cervical adenopathy.  Skin:    General: Skin is warm and dry.  Neurological:     Mental Status: He is alert and oriented to person, place, and time.  Psychiatric:        Mood and Affect: Mood normal.     (all labs ordered are listed, but only abnormal results are displayed) Labs Reviewed  CBC WITH DIFFERENTIAL/PLATELET - Abnormal; Notable for the following components:      Result Value   WBC 24.0 (*)    Neutro Abs 20.8 (*)    Monocytes Absolute 1.7 (*)    Abs Immature Granulocytes 0.12 (*)    All other components within normal limits  COMPREHENSIVE METABOLIC PANEL WITH GFR - Abnormal; Notable for the following components:   Glucose, Bld 113 (*)    Anion gap 16 (*)    All other components within normal limits  RESP PANEL BY RT-PCR (RSV, FLU A&B, COVID)  RVPGX2  LIPASE, BLOOD    EKG: None  Radiology: CT ABDOMEN PELVIS W CONTRAST Result Date: 04/06/2024 EXAM: CT ABDOMEN AND PELVIS WITH CONTRAST 04/06/2024 02:28:35  AM TECHNIQUE: CT of the abdomen and pelvis was performed with the administration of 100 mL of iohexol  (OMNIPAQUE ) 300 MG/ML solution. Multiplanar reformatted images are provided for review. Automated exposure control, iterative reconstruction, and/or weight-based adjustment of the mA/kV was utilized to reduce the radiation dose to as low as reasonably achievable. COMPARISON: CT contrast 01/31/2023. CLINICAL HISTORY: Abdominal pain, acute, nonlocalized. FINDINGS: LOWER CHEST: No acute abnormality. LIVER: The liver is unremarkable. GALLBLADDER AND BILE DUCTS: Gallbladder is unremarkable. No biliary ductal dilatation. SPLEEN: No acute abnormality. PANCREAS: No acute  abnormality. ADRENAL GLANDS: No acute abnormality. KIDNEYS, URETERS AND BLADDER: No stones in the kidneys or ureters. No hydronephrosis. No perinephric or periureteral stranding. Urinary bladder is unremarkable. GI AND BOWEL: Stomach demonstrates no acute abnormality. Decompressed colon. Appendix unremarkable. No small or large bowel thickening or dilatation. There is no bowel obstruction. PERITONEUM AND RETROPERITONEUM: No ascites. No free air. VASCULATURE: Aorta is normal in caliber. LYMPH NODES: No lymphadenopathy. REPRODUCTIVE ORGANS: No acute abnormality. BONES AND SOFT TISSUES: No acute osseous abnormality. No focal soft tissue abnormality. IMPRESSION: 1. No acute findings in the abdomen or pelvis. Electronically signed by: Morgane Naveau MD 04/06/2024 02:41 AM EST RP Workstation: [id number]     Procedures   Medications Ordered in the ED  lactated ringers  bolus 1,000 mL (0 mLs Intravenous Stopped 04/06/24 0245)  ondansetron  (ZOFRAN ) injection 4 mg (4 mg Intravenous Given 04/06/24 0120)  morphine  (PF) 4 MG/ML injection 4 mg (4 mg Intravenous Given 04/06/24 0214)  iohexol  (OMNIPAQUE ) 300 MG/ML solution 100 mL (100 mLs Intravenous Contrast Given 04/06/24 0220)                                    Medical Decision Making Amount and/or Complexity of Data Reviewed Labs: ordered. Radiology: ordered.  Risk Prescription drug management.   This patient presents to the ED for concern of nausea, vomiting, diarrhea, this involves an extensive number of treatment options, and is a complaint that carries with it a high risk of complications and morbidity.  I considered the following differential and admission for this acute, potentially life threatening condition.  The differential diagnosis includes viral gastroenteritis, pancreatitis, cholecystitis, appendicitis, obstruction  MDM:    This is a 33 year old male who presents with fairly acute onset nausea, vomiting, diarrhea.  He is ill-appearing but  nontoxic and vital signs are notable for temperature 100.1.  He is mildly tachycardic.  Patient given fluids and nausea medication.  Labs obtained.  Notable for a leukocytosis to 24 with a left shift.  This seems out of proportion to a potential viral illness.  For this reason, CT imaging was obtained although his abdominal exam is nonspecific.  CT scan is negative for acute findings.  COVID and flu are negative.  This is viral etiology.  Patient reports significant relief of symptoms with fluids and nausea medication.  We discussed symptom control at home.  (Labs, imaging, consults)  Labs: I Ordered, and personally interpreted labs.  The pertinent results include: CBC, CMP, lipase  Imaging Studies ordered: I ordered imaging studies including CT I independently visualized and interpreted imaging. I agree with the radiologist interpretation  Additional history obtained from chart review.  External records from outside source obtained and reviewed including prior evaluations  Cardiac Monitoring: The patient was maintained on a cardiac monitor.  If on the cardiac monitor, I personally viewed and interpreted the cardiac monitored which showed an underlying rhythm  of: Sinus  Reevaluation: After the interventions noted above, I reevaluated the patient and found that they have :improved  Social Determinants of Health:  lives independently  Disposition: Discharge  Co morbidities that complicate the patient evaluation History reviewed. No pertinent past medical history.   Medicines Meds ordered this encounter  Medications   lactated ringers  bolus 1,000 mL   ondansetron  (ZOFRAN ) injection 4 mg   morphine  (PF) 4 MG/ML injection 4 mg   iohexol  (OMNIPAQUE ) 300 MG/ML solution 100 mL   ondansetron  (ZOFRAN -ODT) 4 MG disintegrating tablet    Sig: Take 1 tablet (4 mg total) by mouth every 8 (eight) hours as needed.    Dispense:  20 tablet    Refill:  0    I have reviewed the patients home  medicines and have made adjustments as needed  Problem List / ED Course: Problem List Items Addressed This Visit   None Visit Diagnoses       Gastroenteritis    -  Primary                Final diagnoses:  Gastroenteritis    ED Discharge Orders          Ordered    ondansetron  (ZOFRAN -ODT) 4 MG disintegrating tablet  Every 8 hours PRN        04/06/24 0307               Bari Charmaine FALCON, MD 04/06/24 [phone]

## 2024-04-06 NOTE — ED Triage Notes (Signed)
 Pt to ED reporting abd pain with NVD. No fevers.
